# Patient Record
Sex: Female | Born: 1965 | Race: White | Hispanic: No | Smoking: Never smoker
Health system: Southern US, Community
[De-identification: ages and names within clinical notes are randomized; demographics above are authoritative.]

## PROBLEM LIST (undated history)

## (undated) DIAGNOSIS — C801 Malignant (primary) neoplasm, unspecified: Secondary | ICD-10-CM

---

## 2021-05-02 ENCOUNTER — Emergency Department: Payer: Self-pay

## 2021-05-02 ENCOUNTER — Emergency Department
Admission: EM | Admit: 2021-05-02 | Discharge: 2021-05-02 | Disposition: A | Discharge status: Home / Self Care | Payer: Self-pay | Attending: Emergency Medicine | Admitting: Emergency Medicine

## 2021-05-02 ENCOUNTER — Other Ambulatory Visit: Payer: Self-pay

## 2021-05-02 DIAGNOSIS — N939 Abnormal uterine and vaginal bleeding, unspecified: Secondary | ICD-10-CM

## 2021-05-02 DIAGNOSIS — N840 Polyp of corpus uteri: Secondary | ICD-10-CM | POA: Insufficient documentation

## 2021-05-02 DIAGNOSIS — N3000 Acute cystitis without hematuria: Secondary | ICD-10-CM | POA: Insufficient documentation

## 2021-05-02 LAB — CBC WITH DIFFERENTIAL/PLATELET
Abs Immature Granulocytes: 0.02 10*3/uL (ref 0.00–0.07)
Basophils Absolute: 0.1 10*3/uL (ref 0.0–0.1)
Basophils Relative: 1 %
Eosinophils Absolute: 0.3 10*3/uL (ref 0.0–0.5)
Eosinophils Relative: 3 %
HCT: 42 % (ref 36.0–46.0)
Hemoglobin: 13.2 g/dL (ref 12.0–15.0)
Immature Granulocytes: 0 %
Lymphocytes Relative: 23 %
Lymphs Abs: 1.9 10*3/uL (ref 0.7–4.0)
MCH: 28.9 pg (ref 26.0–34.0)
MCHC: 31.4 g/dL (ref 30.0–36.0)
MCV: 92.1 fL (ref 80.0–100.0)
Monocytes Absolute: 0.5 10*3/uL (ref 0.1–1.0)
Monocytes Relative: 6 %
Neutro Abs: 5.6 10*3/uL (ref 1.7–7.7)
Neutrophils Relative %: 67 %
Platelets: 329 10*3/uL (ref 150–400)
RBC: 4.56 MIL/uL (ref 3.87–5.11)
RDW: 12.1 % (ref 11.5–15.5)
WBC: 8.3 10*3/uL (ref 4.0–10.5)
nRBC: 0 % (ref 0.0–0.2)

## 2021-05-02 LAB — COMPREHENSIVE METABOLIC PANEL
ALT: 15 U/L (ref 0–44)
AST: 18 U/L (ref 15–41)
Albumin: 4.2 g/dL (ref 3.5–5.0)
Alkaline Phosphatase: 84 U/L (ref 38–126)
Anion gap: 10 (ref 5–15)
BUN: 10 mg/dL (ref 6–20)
CO2: 27 mmol/L (ref 22–32)
Calcium: 9.7 mg/dL (ref 8.9–10.3)
Chloride: 101 mmol/L (ref 98–111)
Creatinine, Ser: 0.69 mg/dL (ref 0.44–1.00)
GFR, Estimated: >60 mL/min (ref >60)
Glucose, Bld: 113 mg/dL — ABNORMAL HIGH (ref 70–99)
Potassium: 3.7 mmol/L (ref 3.5–5.1)
Sodium: 138 mmol/L (ref 135–145)
Total Bilirubin: 0.8 mg/dL (ref 0.3–1.2)
Total Protein: 8 g/dL (ref 6.5–8.1)

## 2021-05-02 LAB — TYPE AND SCREEN
ABO/RH(D): O POS
Antibody Screen: NEGATIVE

## 2021-05-02 LAB — URINALYSIS, ROUTINE W REFLEX MICROSCOPIC
Bacteria, UA: NONE SEEN
Glucose, UA: NEGATIVE mg/dL
Ketones, ur: 15 mg/dL — AB
Nitrite: POSITIVE — AB
Protein, ur: >300 mg/dL — AB
RBC / HPF: >50 RBC/hpf — ABNORMAL HIGH (ref 0–5)
Specific Gravity, Urine: 1.025 (ref 1.005–1.030)
WBC, UA: >50 WBC/hpf — ABNORMAL HIGH (ref 0–5)
pH: 5 (ref 5.0–8.0)

## 2021-05-02 LAB — POC URINE PREG, ED: Preg Test, Ur: NEGATIVE

## 2021-05-02 MED ORDER — CEPHALEXIN 500 MG PO CAPS
500.0000 mg | ORAL_CAPSULE | Freq: Two times a day (BID) | ORAL | 0 refills | Status: AC
Start: 2021-05-02 — End: 2021-05-09

## 2021-05-02 NOTE — ED Triage Notes (Signed)
Pt c/o vaginal bleeding since august and in the past 4 days having heavy bleeding with clots.

## 2021-05-02 NOTE — ED Notes (Signed)
Provider performed speculum exam.

## 2021-05-02 NOTE — ED Provider Notes (Signed)
[facility] Provider Note    Event Date/Time   First MD Initiated Contact with Patient 05/02/21 1601     (approximate)   History   Vaginal Bleeding   HPI  Andrea Gross is a 56 y.o. female with history of lymphedema presents to the emergency department for evaluation of vaginal bleeding that has been intermittent since August. Within the past 2 weeks, she has had persistent bleeding with increase and passing clots over the past 4 days. No fever or abdominal pain.      Physical Exam   Triage Vital Signs: ED Triage Vitals  Enc Vitals Group     BP 05/02/21 1540 (!) 137/94     Pulse Rate 05/02/21 1540 76     Resp 05/02/21 1540 17     Temp 05/02/21 1540 97.7 F (36.5 C)     Temp Source 05/02/21 1540 Oral     SpO2 05/02/21 1540 96 %     Weight 05/02/21 1542 180 lb (81.6 kg)     Height 05/02/21 1542 5\' 1"  (1.549 m)     Head Circumference --      Peak Flow --      Pain Score --      Pain Loc --      Pain Edu? --      Excl. in [place]? --     Most recent vital signs: Vitals:   05/02/21 1540 05/02/21 1840  BP: (!) 137/94 138/74  Pulse: 76 70  Resp: 17 20  Temp: 97.7 F (36.5 C)   SpO2: 96% 99%    General: Awake, no distress.  CV:  Good peripheral perfusion.  Resp:  Normal effort.  Abd:  No distention.  Other:  Cervix firm and friable, polyp noted on vaginal wall; scant dark red blood in vaginal  vault.    ED Results / Procedures / Treatments   Labs (all labs ordered are listed, but only abnormal results are displayed) Labs Reviewed  COMPREHENSIVE METABOLIC PANEL - Abnormal; Notable for the following components:      Result Value   Glucose, Bld 113 (*)    All other components within normal limits  URINALYSIS, ROUTINE W REFLEX MICROSCOPIC - Abnormal; Notable for the following components:   Color, Urine AMBER (*)    APPearance CLOUDY (*)    Hgb urine dipstick LARGE (*)    Bilirubin Urine MODERATE (*)    Ketones, ur 15 (*)    Protein,  ur >300 (*)    Nitrite POSITIVE (*)    Leukocytes,Ua SMALL (*)    RBC / HPF >50 (*)    WBC, UA >50 (*)    All other components within normal limits  CBC WITH DIFFERENTIAL/PLATELET  POC URINE PREG, ED  TYPE AND SCREEN     EKG  Not indicated   RADIOLOGY  US abdomen and pelvis shows an endometrial polyp at the fundus with a small amount of endometrial fluid.   PROCEDURES:  Critical Care performed: No  Procedures   MEDICATIONS ORDERED IN ED: Medications - No data to display   IMPRESSION / MDM / [place] / ED COURSE  I reviewed the triage vital signs and the nursing notes.                              Differential diagnosis includes, but is not limited to, abnormal uterine bleeding, fibroids, cervical CA  56 year old female presenting  to the emergency department for treatment and evaluation of vaginal bleeding.  See HPI for further details.   Labs are reassuring.  She has a normal hemoglobin and hematocrit.  Urinalysis is consistent with an acute cystitis she does have a positive nitrates, small leukocytes, greater than 50 white blood cells.  She reports that she has had some frequency but no dysuria.  We will treat with a few days of Keflex.  Results of the ultrasound discussed with the patient.  She was strongly encouraged to schedule a follow-up appointment with gynecology.  Patient states that she plans on moving back to [place] within the next few weeks and will schedule follow-up appointment then.  She was advised to go ahead and call and schedule the appointment that way when she gets there it will be scheduled.  If she decides not to return to [place], she was encouraged to call and schedule follow-up appointment here.  Patient is concerned about not having insurance in [place].  She was given information for the [place], open-door clinic, and on-call gynecology.  She was strongly advised to be evaluated as soon as possible.  She  was encouraged to return to the emergency department if the bleeding becomes bright red, heavy and feels a pad in 1 hour or less, or for other symptoms of concern.    FINAL CLINICAL IMPRESSION(S) / ED DIAGNOSES   Final diagnoses:  Vaginal bleeding  Acute cystitis without hematuria  Abnormal uterine bleeding due to endometrial polyp     Rx / DC Orders   ED Discharge Orders          Ordered    cephALEXin (KEFLEX) 500 MG capsule  2 times daily        05/02/21 1835             Note:  This document was prepared using Dragon voice recognition software and may include unintentional dictation errors.   Victorino Dike, FNP 05/05/21 2350    Lucrezia Starch, MD 05/06/21 1110

## 2021-05-02 NOTE — ED Notes (Signed)
Pt to ED for vaginal bleeding. States has had normal monthly periods until 7/22, then next period in 8/22 started and "never stopped" with daily bleeding, but normal amount as regular periods.  Last 4 days has had "gushing and blood clots", some are stringy, some round, some flat, largest size was "the size of an egg" and round. Pt also has lower abdominal cramping over last several days that is more pronounced than usual period cramping. States has been changing pads more often and some times has had to change large maternity pads more often than every hour.  Pt denies dizziness or weakness. Denies CP and SOB.

## 2022-06-21 ENCOUNTER — Other Ambulatory Visit: Payer: Self-pay

## 2022-06-21 DIAGNOSIS — E876 Hypokalemia: Secondary | ICD-10-CM | POA: Insufficient documentation

## 2022-06-21 DIAGNOSIS — G8929 Other chronic pain: Secondary | ICD-10-CM | POA: Insufficient documentation

## 2022-06-21 DIAGNOSIS — C539 Malignant neoplasm of cervix uteri, unspecified: Secondary | ICD-10-CM | POA: Insufficient documentation

## 2022-06-21 DIAGNOSIS — D649 Anemia, unspecified: Secondary | ICD-10-CM | POA: Insufficient documentation

## 2022-06-21 DIAGNOSIS — R102 Pelvic and perineal pain: Secondary | ICD-10-CM | POA: Diagnosis present

## 2022-06-21 DIAGNOSIS — N390 Urinary tract infection, site not specified: Secondary | ICD-10-CM | POA: Insufficient documentation

## 2022-06-21 LAB — CBC
HCT: 23.5 % — ABNORMAL LOW (ref 36.0–46.0)
Hemoglobin: 6.4 g/dL — ABNORMAL LOW (ref 12.0–15.0)
MCH: 18.3 pg — ABNORMAL LOW (ref 26.0–34.0)
MCHC: 27.2 g/dL — ABNORMAL LOW (ref 30.0–36.0)
MCV: 67.3 fL — ABNORMAL LOW (ref 80.0–100.0)
Platelets: 596 10*3/uL — ABNORMAL HIGH (ref 150–400)
RBC: 3.49 MIL/uL — ABNORMAL LOW (ref 3.87–5.11)
RDW: 20.2 % — ABNORMAL HIGH (ref 11.5–15.5)
WBC: 8.8 10*3/uL (ref 4.0–10.5)
nRBC: 0 % (ref 0.0–0.2)

## 2022-06-21 LAB — COMPREHENSIVE METABOLIC PANEL
ALT: 7 U/L (ref 0–44)
AST: 12 U/L — ABNORMAL LOW (ref 15–41)
Albumin: 3.2 g/dL — ABNORMAL LOW (ref 3.5–5.0)
Alkaline Phosphatase: 65 U/L (ref 38–126)
Anion gap: 7 (ref 5–15)
BUN: 10 mg/dL (ref 6–20)
CO2: 28 mmol/L (ref 22–32)
Calcium: 8.6 mg/dL — ABNORMAL LOW (ref 8.9–10.3)
Chloride: 98 mmol/L (ref 98–111)
Creatinine, Ser: 0.55 mg/dL (ref 0.44–1.00)
GFR, Estimated: >60 mL/min (ref >60)
Glucose, Bld: 109 mg/dL — ABNORMAL HIGH (ref 70–99)
Potassium: 2.8 mmol/L — ABNORMAL LOW (ref 3.5–5.1)
Sodium: 133 mmol/L — ABNORMAL LOW (ref 135–145)
Total Bilirubin: 0.5 mg/dL (ref 0.3–1.2)
Total Protein: 8 g/dL (ref 6.5–8.1)

## 2022-06-21 LAB — LIPASE, BLOOD: Lipase: 38 U/L (ref 11–51)

## 2022-06-21 NOTE — ED Triage Notes (Signed)
Patient reports pelvic pain. Patient has been taking aleve and ibuprofen. Patient has a history of cancer. Medication is not helping the pain. No changes in bowel habits. Patient reports not being able to eat much. Endorses nausea with no vomiting. Patient lives in [place] and is here visiting her grandchildren.

## 2022-06-21 NOTE — ED Notes (Signed)
Called lab to help obtain blood work in triage.

## 2022-06-22 ENCOUNTER — Emergency Department
Admission: EM | Admit: 2022-06-22 | Discharge: 2022-06-22 | Disposition: A | Discharge status: Home / Self Care | Payer: BLUE CROSS/BLUE SHIELD | Attending: Emergency Medicine | Admitting: Emergency Medicine

## 2022-06-22 ENCOUNTER — Encounter: Payer: Self-pay | Admitting: Emergency Medicine

## 2022-06-22 DIAGNOSIS — D500 Iron deficiency anemia secondary to blood loss (chronic): Secondary | ICD-10-CM | POA: Insufficient documentation

## 2022-06-22 DIAGNOSIS — Z791 Long term (current) use of non-steroidal anti-inflammatories (NSAID): Secondary | ICD-10-CM

## 2022-06-22 DIAGNOSIS — R102 Pelvic and perineal pain: Secondary | ICD-10-CM | POA: Diagnosis not present

## 2022-06-22 DIAGNOSIS — C539 Malignant neoplasm of cervix uteri, unspecified: Secondary | ICD-10-CM

## 2022-06-22 DIAGNOSIS — Z8719 Personal history of other diseases of the digestive system: Secondary | ICD-10-CM

## 2022-06-22 DIAGNOSIS — G8929 Other chronic pain: Secondary | ICD-10-CM

## 2022-06-22 DIAGNOSIS — E876 Hypokalemia: Secondary | ICD-10-CM | POA: Insufficient documentation

## 2022-06-22 DIAGNOSIS — D649 Anemia, unspecified: Secondary | ICD-10-CM | POA: Diagnosis not present

## 2022-06-22 DIAGNOSIS — N390 Urinary tract infection, site not specified: Secondary | ICD-10-CM | POA: Insufficient documentation

## 2022-06-22 DIAGNOSIS — N939 Abnormal uterine and vaginal bleeding, unspecified: Secondary | ICD-10-CM

## 2022-06-22 HISTORY — DX: Malignant (primary) neoplasm, unspecified: C80.1

## 2022-06-22 LAB — RETICULOCYTES
Immature Retic Fract: 12.2 % (ref 2.3–15.9)
RBC.: 3.21 MIL/uL — ABNORMAL LOW (ref 3.87–5.11)
Retic Count, Absolute: 38.5 10*3/uL (ref 19.0–186.0)
Retic Ct Pct: 1.2 % (ref 0.4–3.1)

## 2022-06-22 LAB — URINALYSIS, ROUTINE W REFLEX MICROSCOPIC
Bilirubin Urine: NEGATIVE
Glucose, UA: NEGATIVE mg/dL
Ketones, ur: NEGATIVE mg/dL
Nitrite: NEGATIVE
Protein, ur: >=300 mg/dL — AB
RBC / HPF: >50 RBC/hpf (ref 0–5)
Specific Gravity, Urine: 1.02 (ref 1.005–1.030)
Squamous Epithelial / HPF: NONE SEEN /HPF (ref 0–5)
WBC, UA: >50 WBC/hpf (ref 0–5)
pH: 5 (ref 5.0–8.0)

## 2022-06-22 LAB — IRON AND TIBC
Iron: 11 ug/dL — ABNORMAL LOW (ref 28–170)
Saturation Ratios: 4 % — ABNORMAL LOW (ref 10.4–31.8)
TIBC: 308 ug/dL (ref 250–450)
UIBC: 297 ug/dL

## 2022-06-22 LAB — VITAMIN B12: Vitamin B-12: 400 pg/mL (ref 180–914)

## 2022-06-22 LAB — PREPARE RBC (CROSSMATCH)

## 2022-06-22 LAB — MAGNESIUM: Magnesium: 1.8 mg/dL (ref 1.7–2.4)

## 2022-06-22 LAB — FERRITIN: Ferritin: 6 ng/mL — ABNORMAL LOW (ref 11–307)

## 2022-06-22 LAB — FOLATE: Folate: 11.9 ng/mL (ref >5.9)

## 2022-06-22 MED ORDER — OXYCODONE-ACETAMINOPHEN 5-325 MG PO TABS: 2.0000 | ORAL_TABLET | Freq: Four times a day (QID) | ORAL | 0 refills | Status: AC | PRN

## 2022-06-22 MED ORDER — DOCUSATE SODIUM 100 MG PO CAPS: 100.0000 mg | ORAL_CAPSULE | Freq: Two times a day (BID) | ORAL | 2 refills | Status: AC

## 2022-06-22 MED ORDER — SODIUM CHLORIDE 0.9 % IV SOLN
10.0000 mL/h | Freq: Once | INTRAVENOUS | Status: AC
  Administered 2022-06-22: 10 mL/h via INTRAVENOUS

## 2022-06-22 MED ORDER — MORPHINE SULFATE (PF) 4 MG/ML IV SOLN
4.0000 mg | Freq: Once | INTRAVENOUS | Status: AC
  Administered 2022-06-22: 4 mg via INTRAVENOUS
  Filled 2022-06-22: qty 1

## 2022-06-22 MED ORDER — POTASSIUM CHLORIDE CRYS ER 20 MEQ PO TBCR
40.0000 meq | EXTENDED_RELEASE_TABLET | Freq: Once | ORAL | Status: AC
  Administered 2022-06-22: 40 meq via ORAL
  Filled 2022-06-22: qty 2

## 2022-06-22 MED ORDER — FERROUS SULFATE 325 (65 FE) MG PO TBEC: 325.0000 mg | DELAYED_RELEASE_TABLET | Freq: Every day | ORAL | 11 refills | Status: AC

## 2022-06-22 MED ORDER — ONDANSETRON HCL 4 MG/2ML IJ SOLN
4.0000 mg | Freq: Once | INTRAMUSCULAR | Status: AC
  Administered 2022-06-22: 4 mg via INTRAVENOUS
  Filled 2022-06-22: qty 2

## 2022-06-22 MED ORDER — ONDANSETRON 4 MG PO TBDP: 4.0000 mg | ORAL_TABLET | Freq: Four times a day (QID) | ORAL | 0 refills | Status: AC | PRN

## 2022-06-22 MED ORDER — SODIUM CHLORIDE 0.9 % IV SOLN
2.0000 g | Freq: Once | INTRAVENOUS | Status: AC
  Administered 2022-06-22: 2 g via INTRAVENOUS
  Filled 2022-06-22: qty 20

## 2022-06-22 MED ORDER — POTASSIUM CHLORIDE 10 MEQ/100ML IV SOLN
10.0000 meq | INTRAVENOUS | Status: AC
  Administered 2022-06-22 (×2): 10 meq via INTRAVENOUS
  Filled 2022-06-22: qty 100

## 2022-06-22 NOTE — Progress Notes (Signed)
IV obtained by ED RN. 

## 2022-06-22 NOTE — Assessment & Plan Note (Signed)
Urinalysis consistent with UTI however patient denies dysuria Antibiotic per ED preference

## 2022-06-22 NOTE — Assessment & Plan Note (Addendum)
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

## 2022-06-22 NOTE — Assessment & Plan Note (Signed)
NSAID long-term use Patient has a history of hematemesis back in November 2023 while in [place].  EGD was negative She denies any nausea, vomiting, blood in stool or black stool Judicious NSAID use Recommend PPI

## 2022-06-22 NOTE — Assessment & Plan Note (Signed)
Recommend IV and oral potassium repletion in the ED with recheck prior to discharge and recheck on follow-up with PCP on return to [place] in 3 to 4 days

## 2022-06-22 NOTE — Consult Note (Addendum)
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 Patient is symptomatic for fatigue only Patient reports vaginal blood loss is no worse than baseline and has been ongoing for several months to years (corroborated on review of discharge summary from hospitalization in [place] in November 2023 Patient has history of GI bleed but this is not suspected at this time Agree with transfusion of 1 unit PRBC for hemoglobin of 6.4 Follow-up on return to [place] which patient says will happen in 2 to 3 days  History of upper GI bleed 02/2022 with negative EGD NSAID long-term use Patient has a history of hematemesis back in November 2023 while in [place].  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 Patient states a pelvic pain is unchanged from her baseline, may be a little worse Continue pain meds with Tylenol, as needed NSAIDs with PPI for GI protection and can cover with as needed oxycodone until return to PCP in a few days  Urinary tract infection Urinalysis consistent with UTI however patient denies dysuria Antibiotic per ED preference  Hypokalemia Recommend IV and oral potassium repletion in the ED with recheck prior to discharge and recheck on follow-up with PCP on return to [place] in 3 to 4 days   Consult was requested for possible admission however was declined for the following reasons. 1.  Patient presented for pelvic pain which can be addressed without need  for admission 2.  Hemoglobin of 6.4 is symptomatic from mild fatigue which can be corrected with transfusion of 1 unit PRBCs in the ED 2.  Patient has not pursued any further treatment of her stage IV cervical cancer and wishes to return to [place] in 2 to 3 days 3.  Potassium can be replenished in the emergency room 4.  For UTI, given that patient is not symptomatic, oral antibiotics can be prescribed from the ED 5.  Patient's pelvic pain appears to be chronic, vaginal bleeding is chronic and stable and her symptoms are expected to recur  given that she has opted for no treatment of her cervical cancer and as such the benefit of hospitalization would be only to transfuse on an as-needed basis and treat symptoms related to her untreated cervical cancer..  This was discussed in detail with the ED provider Dr. Leonides Schanz as well as with patient.  Patient was agreeable, stating that her plan is to return to [place] in 2 to 3 days.    TRH will sign off at present, please call us again when needed.  HPI: Andrea Gross is a 57 y.o. female with past medical history of Stage IV cervical cancer with erosion into bladder who has declined any further follow-up following her diagnosis in November 2023, history of upper GI bleed, currently visiting family from [place] for the past 2 weeks who presents to the ED with pelvic pain beyond her baseline for cervical cancer, unrelieved with her usual Aleve, ibuprofen and Tylenol.  She has vaginal bleeding but it is no worse than her baseline.  She has nausea but  without vomiting.  She denies black or bloody stool. ED course and data review: Tachycardic to 116 with otherwise normal vitals.  Labs notable for hemoglobin of 6.4, potassium of 2.8 and urinalysis with large leukocyte esterase and many bacteria. EKG, independently interpreted showed sinus rhythm at 77. Patient was treated with morphine and Zofran and given a dose of ceftriaxone as well as a potassium 10 mill equivalent  rider and oral potassium 40 mEq.  Transfusion of 1 unit PRBCs was initiated.  Review of Systems: As mentioned in the history of present illness. All other systems reviewed and are negative. Past Medical History:  Diagnosis Date   Cancer [facility])     Social History:  reports that she has never smoked. She has never used smokeless tobacco. She reports that she does not drink alcohol and does not use drugs.  No Known Allergies  No family history on file.  Prior to Admission medications   Medication Sig Start Date End Date Taking? Authorizing Provider  ezetimibe (ZETIA) 10 MG tablet Take 10 mg by mouth daily.    [provider]  hydrochlorothiazide (HYDRODIURIL) 25 MG tablet Take 25 mg by mouth daily.    [provider]  levothyroxine (SYNTHROID) 75 MCG tablet Take 75 mcg by mouth daily before breakfast.    [provider]  PARoxetine (PAXIL) 40 MG tablet Take 40 mg by mouth every morning.    [provider]    Physical Exam: Vitals:   06/21/22 2018 06/22/22 0200 06/22/22 0215 06/22/22 0225  BP: 118/76 123/72    Pulse: (!) 116 94 89   Resp: 15     Temp: 98.3 F (36.8 C)   98.2 F (36.8 C)  TempSrc: Oral   Oral  SpO2: 100% (!) 88% 100%   Weight: 55.3 kg     Height: 5\' 1"  (1.549 m)      Physical Exam Vitals and nursing note reviewed.  Constitutional:      General: She is not in acute distress. HENT:     Head: Normocephalic and atraumatic.  Cardiovascular:     Rate and Rhythm: Normal rate and regular rhythm.     Heart sounds: Normal heart sounds.  Pulmonary:     Effort: Pulmonary effort is normal.     Breath sounds: Normal breath sounds.  Abdominal:     Palpations: Abdomen is soft.     Tenderness: There is no abdominal tenderness.  Neurological:     Mental Status: She is alert. Mental status is at baseline.     Data Reviewed: Relevant notes from primary care and specialist visits, past discharge summaries as available in EHR,  including Care Everywhere. Prior diagnostic testing as pertinent to current admission diagnoses Updated medications and problem lists for reconciliation ED course, including vitals, labs, imaging, treatment and response to treatment Triage notes, nursing and pharmacy notes and ED provider's notes Notable results as noted in HPI   Family Communication: none Primary team communication: Dr Leonides Schanz Thank you very much for involving [place] in the care of your patient.  Author: Athena Masse, MD 06/22/2022 2:46 AM  For on call review [email or web address].

## 2022-06-22 NOTE — Assessment & Plan Note (Signed)
Patient states a pelvic pain is unchanged from her baseline, may be a little worse Continue pain meds with Tylenol, as needed NSAIDs with PPI for GI protection and can cover with as needed oxycodone until return to PCP in a few days

## 2022-06-22 NOTE — ED Provider Notes (Signed)
[facility] Provider Note    Event Date/Time   First MD Initiated Contact with Patient 06/22/22 0107     (approximate)   History   Abdominal Pain   HPI  Andrea Gross is a 57 y.o. female with history of stage IV cervical cancer currently not opting for any treatment who presents to the emergency department with complaints of chronic pelvic pain that is uncontrolled with her NSAIDs, continued vaginal bleeding that has been ongoing for months, feeling fatigued, dysuria.  She states that she is here visiting her grandchildren from [place] and plans to go home by [place] bus in the next 2 to 3 days.  She denies that this pain is new for her and states that is chronic in nature from her cancer as is her vaginal bleeding.  She has been anemic and is on iron supplementation but states her last transfusion was when her children were born.  She denies hematemesis, bloody stools or melena.  No fevers, cough, chest pain, shortness of breath, vomiting or diarrhea.  Has had some nausea.   History provided by patient.    Past Medical History:  Diagnosis Date   Cancer [facility])     History reviewed. No pertinent surgical history.  MEDICATIONS:  Prior to Admission medications   Medication Sig Start Date End Date Taking? Authorizing Provider  ezetimibe (ZETIA) 10 MG tablet Take 10 mg by mouth daily.    [provider]  hydrochlorothiazide (HYDRODIURIL) 25 MG tablet Take 25 mg by mouth daily.    [provider]  levothyroxine (SYNTHROID) 75 MCG tablet Take 75 mcg by mouth daily before breakfast.    [provider]  PARoxetine (PAXIL) 40 MG tablet Take 40 mg by mouth every morning.    [provider]    Physical Exam   Triage Vital Signs: ED Triage Vitals [06/21/22 2018]  Enc Vitals Group     BP 118/76     Pulse Rate (!) 116     Resp 15     Temp 98.3 F (36.8 C)     Temp Source Oral     SpO2 100 %     Weight 122 lb (55.3 kg)      Height 5\' 1"  (1.549 m)     Head Circumference      Peak Flow      Pain Score 10     Pain Loc      Pain Edu?      Excl. in [place]?     Most recent vital signs: Vitals:   06/22/22 0400 06/22/22 0415  BP: 115/66 (!) 111/57  Pulse: 77 80  Resp: 17 (!) 29  Temp:    SpO2: 100% 100%    CONSTITUTIONAL: Alert, responds appropriately to questions.  Chronically ill-appearing, in no distress HEAD: Normocephalic, atraumatic EYES: Conjunctivae clear, pupils appear equal, sclera nonicteric ENT: normal nose; moist mucous membranes NECK: Supple, normal ROM CARD: RRR; S1 and S2 appreciated RESP: Normal chest excursion without splinting or tachypnea; breath sounds clear and equal bilaterally; no wheezes, no rhonchi, no rales, no hypoxia or respiratory distress, speaking full sentences ABD/GI: Non-distended; soft, non-tender, no rebound, no guarding, no peritoneal signs BACK: The back appears normal EXT: Normal ROM in all joints; no deformity noted, no edema SKIN: Normal color for age and race; warm; no rash on exposed skin NEURO: Moves all extremities equally, normal speech PSYCH: The patient's mood and manner are appropriate.   ED Results / Procedures /  Treatments   LABS: (all labs ordered are listed, but only abnormal results are displayed) Labs Reviewed  COMPREHENSIVE METABOLIC PANEL - Abnormal; Notable for the following components:      Result Value   Sodium 133 (*)    Potassium 2.8 (*)    Glucose, Bld 109 (*)    Calcium 8.6 (*)    Albumin 3.2 (*)    AST 12 (*)    All other components within normal limits  CBC - Abnormal; Notable for the following components:   RBC 3.49 (*)    Hemoglobin 6.4 (*)    HCT 23.5 (*)    MCV 67.3 (*)    MCH 18.3 (*)    MCHC 27.2 (*)    RDW 20.2 (*)    Platelets 596 (*)    All other components within normal limits  URINALYSIS, ROUTINE W REFLEX MICROSCOPIC - Abnormal; Notable for the following components:   Color, Urine AMBER (*)    APPearance  CLOUDY (*)    Hgb urine dipstick LARGE (*)    Protein, ur >=300 (*)    Leukocytes,Ua LARGE (*)    Bacteria, UA MANY (*)    All other components within normal limits  IRON AND TIBC - Abnormal; Notable for the following components:   Iron 11 (*)    Saturation Ratios 4 (*)    All other components within normal limits  FERRITIN - Abnormal; Notable for the following components:   Ferritin 6 (*)    All other components within normal limits  RETICULOCYTES - Abnormal; Notable for the following components:   RBC. 3.21 (*)    All other components within normal limits  URINE CULTURE  LIPASE, BLOOD  FOLATE  MAGNESIUM  VITAMIN B12  TYPE AND SCREEN  PREPARE RBC (CROSSMATCH)     EKG:  EKG Interpretation  Date/Time:  Sunday June 22 2022 02:22:06 EDT Ventricular Rate:  77 PR Interval:  181 QRS Duration: 96 QT Interval:  397 QTC Calculation: 450 R Axis:   28 Text Interpretation: Sinus rhythm Confirmed by Pryor Curia [phone]) on 06/22/2022 2:51:13 AM         RADIOLOGY: My personal review and interpretation of imaging:    I have personally reviewed all radiology reports.   No results found.   PROCEDURES:  Critical Care performed: Yes, see critical care procedure note(s)   CRITICAL CARE Performed by: Pryor Curia   Total critical care time: 40 minutes  Critical care time was exclusive of separately billable procedures and treating other patients.  Critical care was necessary to treat or prevent imminent or life-threatening deterioration.  Critical care was time spent personally by me on the following activities: development of treatment plan with patient and/or surrogate as well as nursing, discussions with consultants, evaluation of patient's response to treatment, examination of patient, obtaining history from patient or surrogate, ordering and performing treatments and interventions, ordering and review of laboratory studies, ordering and review of radiographic  studies, pulse oximetry and re-evaluation of patient's condition.   Marland Kitchen1-3 Lead EKG Interpretation  Performed by: Aleksandr Pellow, Delice Bison, DO Authorized by: Tamiki Kuba, Delice Bison, DO     Interpretation: normal     ECG rate:  89   ECG rate assessment: normal     Rhythm: sinus rhythm     Ectopy: none     Conduction: normal       IMPRESSION / MDM / ASSESSMENT AND PLAN / ED COURSE  I reviewed the triage vital signs and the nursing  notes.    Patient here with complaints of chronic pelvic pain, vaginal bleeding and feeling fatigued.  States she is here just for symptomatic relief.  The patient is on the cardiac monitor to evaluate for evidence of arrhythmia and/or significant heart rate changes.   DIFFERENTIAL DIAGNOSIS (includes but not limited to):   Anemia, electrolyte derangement, UTI, dehydration, worsening cancer   Patient's presentation is most consistent with acute presentation with potential threat to life or bodily function.   PLAN: Labs obtained from triage.  Patient has a hemoglobin of 6.4 which is down trended from 8.2 in November 2023.  She has a reactive thrombocytosis.  This is likely from chronic vaginal bleeding.  She states she is not interested in chemotherapy, radiation or surgical interventions.  She does agree to a blood transfusion here for symptomatic relief.  She is not on blood thinners.  Denies bloody stools, melena or hematemesis.  Her abdominal exam today is benign.  No indication for emergent imaging given patient states she would not do anything different if something abnormal was found regarding her cancer today.  She does have urinary symptoms and appears to have a UTI here.  Will add on urine culture and give Rocephin.  We did discuss that NSAIDs are likely not a great option for her for pain control given history of gastritis and previous upper GI bleed.  Will give morphine for chronic pain control.   Patient's potassium level also low at 2.8.  She denies any  vomiting or diarrhea.  Magnesium level normal.  EKG shows no interval changes.  Will give IV and oral replacement.   Patient requesting admission to the hospital.   [facility] ED: Medications  0.9 %  sodium chloride infusion (has no administration in time range)  potassium chloride 10 mEq in 100 mL IVPB (10 mEq Intravenous New Bag/Given 06/22/22 0401)  morphine (PF) 4 MG/ML injection 4 mg (4 mg Intravenous Given 06/22/22 0149)  ondansetron (ZOFRAN) injection 4 mg (4 mg Intravenous Given 06/22/22 0149)  potassium chloride SA (KLOR-CON M) CR tablet 40 mEq (40 mEq Oral Given 06/22/22 0207)  cefTRIAXone (ROCEPHIN) 2 g in sodium chloride 0.9 % 100 mL IVPB (0 g Intravenous Stopped 06/22/22 0234)     ED COURSE: Discussed with hospitalist Dr. Damita Dunnings.  After reviewing her records and talking to the patient she feels patient would be appropriate for treatment in the ED and discharged home.  Discussed this with patient and he was comfortable with this plan.  She will get 1 unit of packed red blood cells here, plan on IV potassium and IV antibiotics.   CONSULTS: Dr. Damita Dunnings with hospitalist service has seen patient in consultation.   OUTSIDE RECORDS REVIEWED: Reviewed multiple recent records from [place].       FINAL CLINICAL IMPRESSION(S) / ED DIAGNOSES   Final diagnoses:  Symptomatic anemia  Chronic pelvic pain in female  Hypokalemia  Acute UTI     Rx / DC Orders   ED Discharge Orders          Ordered    ferrous sulfate 325 (65 FE) MG EC tablet  Daily with breakfast        06/22/22 0347    ondansetron (ZOFRAN-ODT) 4 MG disintegrating tablet  Every 6 hours PRN        06/22/22 0347    oxyCODONE-acetaminophen (PERCOCET) 5-325 MG tablet  Every 6 hours PRN        06/22/22 0347    docusate sodium (  COLACE) 100 MG capsule  2 times daily        06/22/22 0347             Note:  This document was prepared using Dragon voice recognition software and may include unintentional  dictation errors.   Kable Haywood, Delice Bison, DO 06/22/22 [phone]

## 2022-06-24 LAB — URINE CULTURE: Culture: >=100000 — AB

## 2022-06-24 LAB — BPAM RBC
Blood Product Expiration Date: 202404182359
ISSUE DATE / TIME: 202403170335
Unit Type and Rh: 5100

## 2022-06-24 LAB — TYPE AND SCREEN
ABO/RH(D): O POS
Antibody Screen: NEGATIVE
Unit division: 0

## 2023-02-21 IMAGING — US US PELVIS COMPLETE WITH TRANSVAGINAL
1 series · 13 of 25 positions shown · non-contrast
Comparison: None

CLINICAL DATA: Vaginal bleeding since [REDACTED], worsened over past 4
days, LMP 11/07/2020



[Series 1: us pelvic complete with transvaginal · 93 acquisitions, 13 frames shown]
[im 1/93]
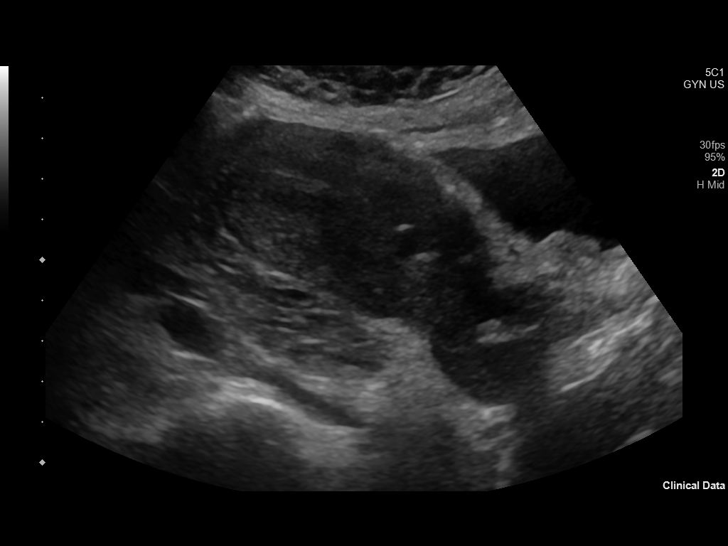
[im 8/93]
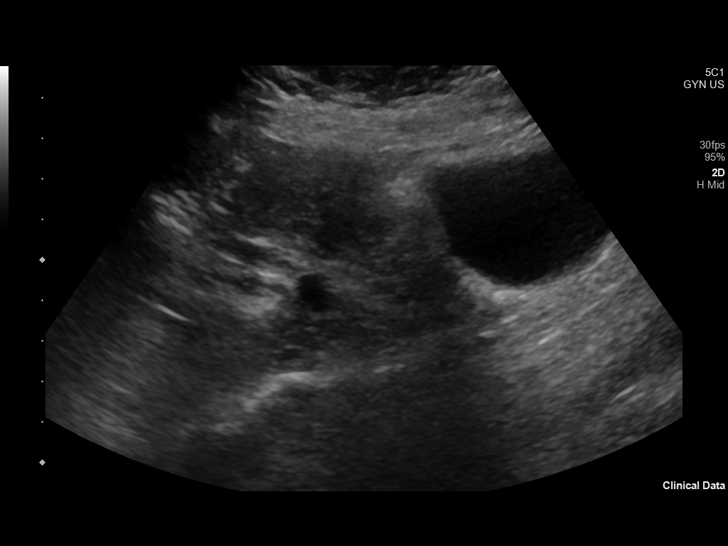
[im 16/93]
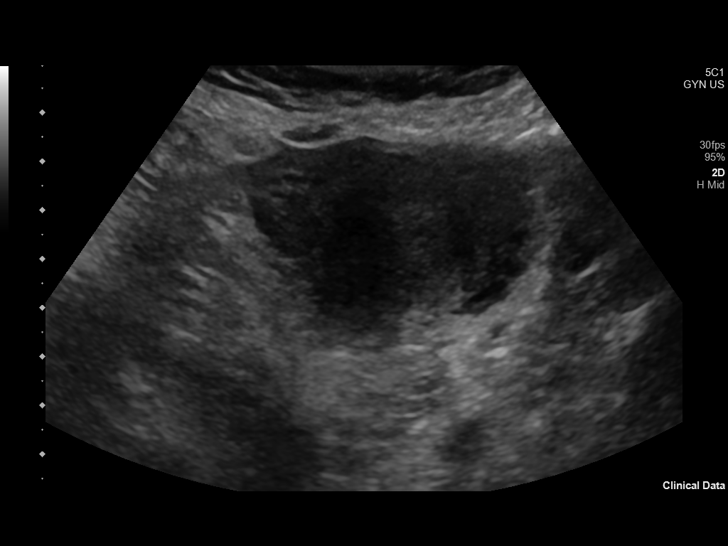
[im 24/93]
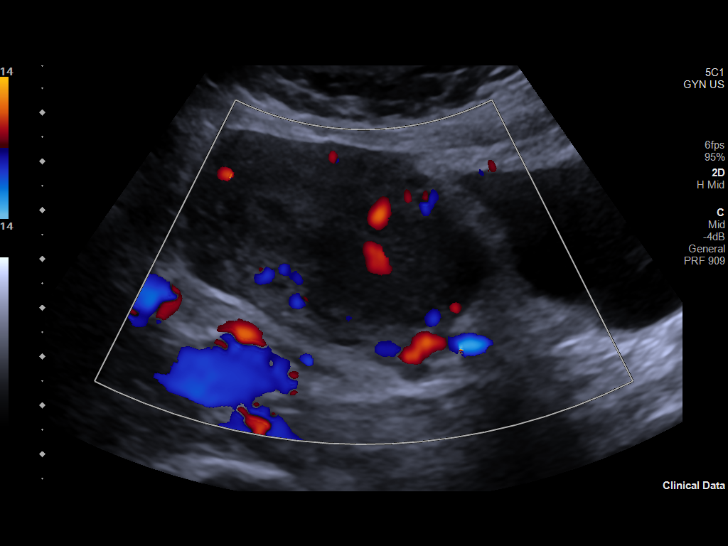
[im 31/93]
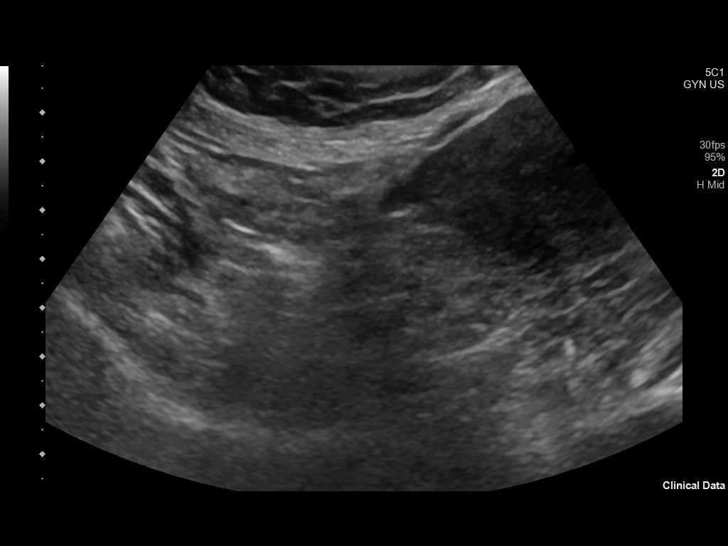
[im 39/93]
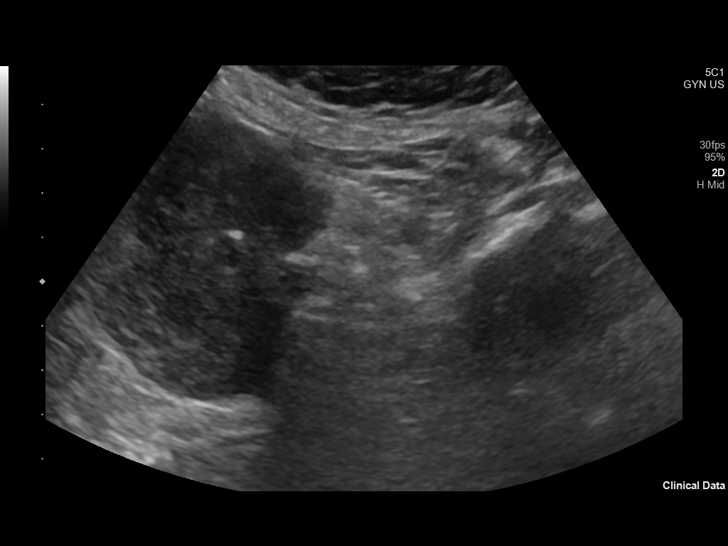
[im 47/93]
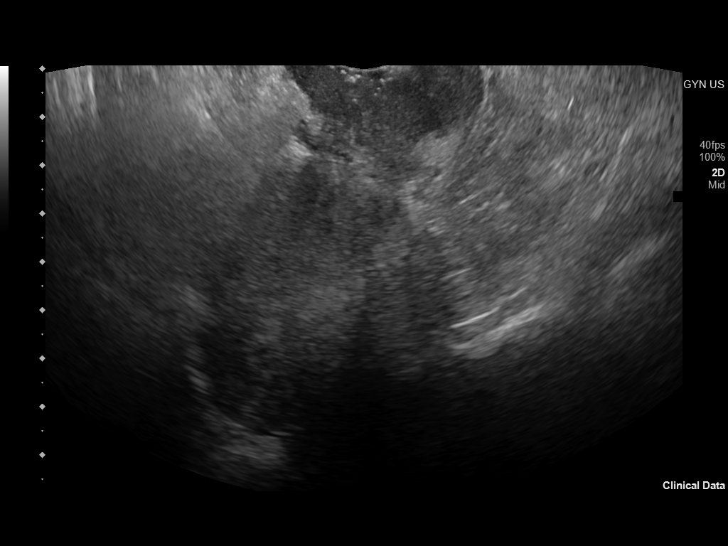
[im 54/93]
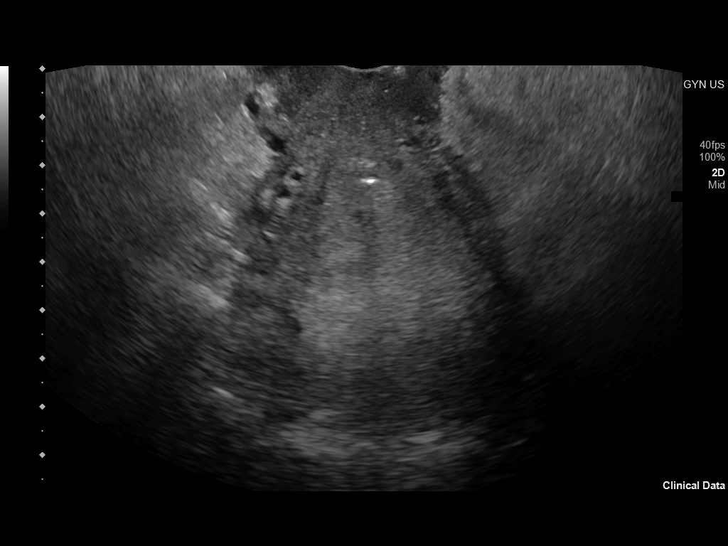
[im 62/93]
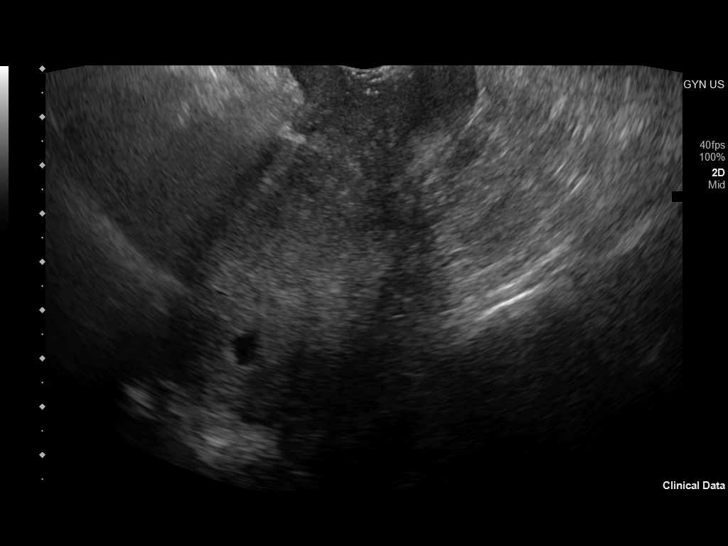
[im 70/93]
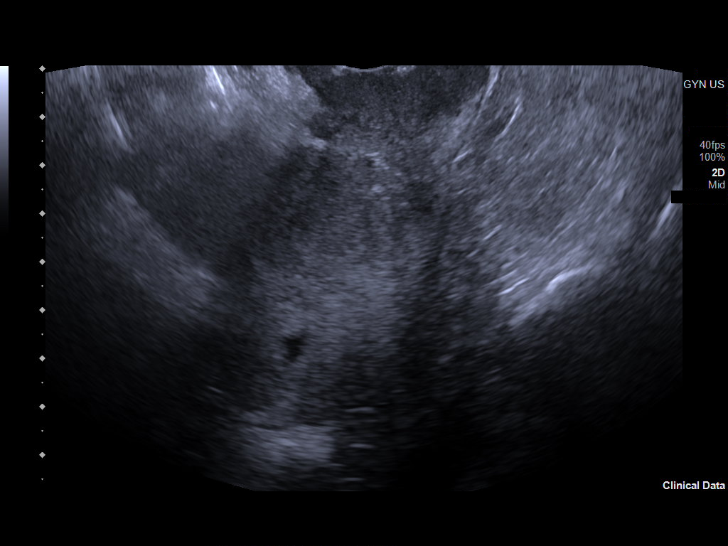
[im 77/93]
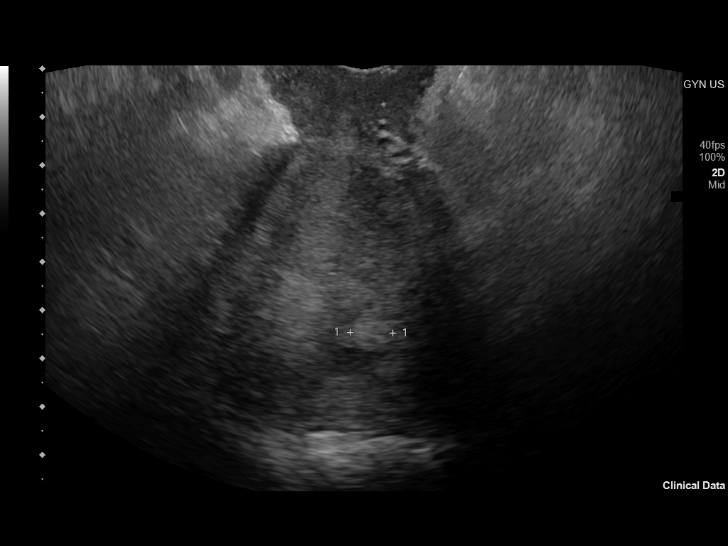
[im 85/93]
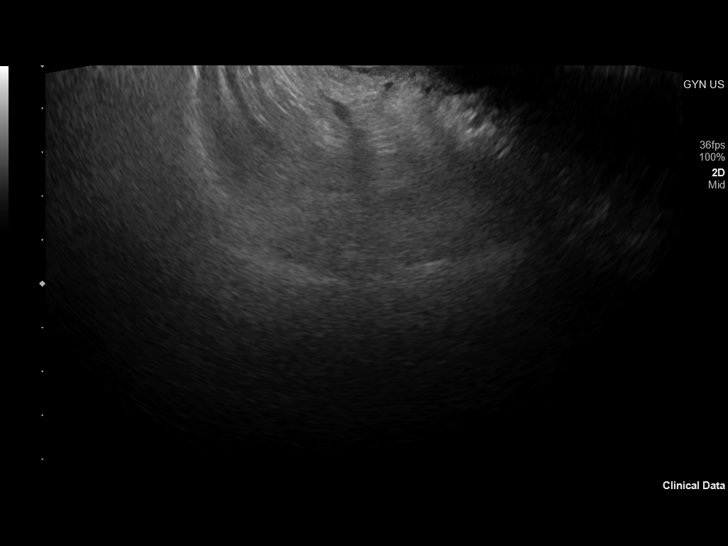
[im 93/93]
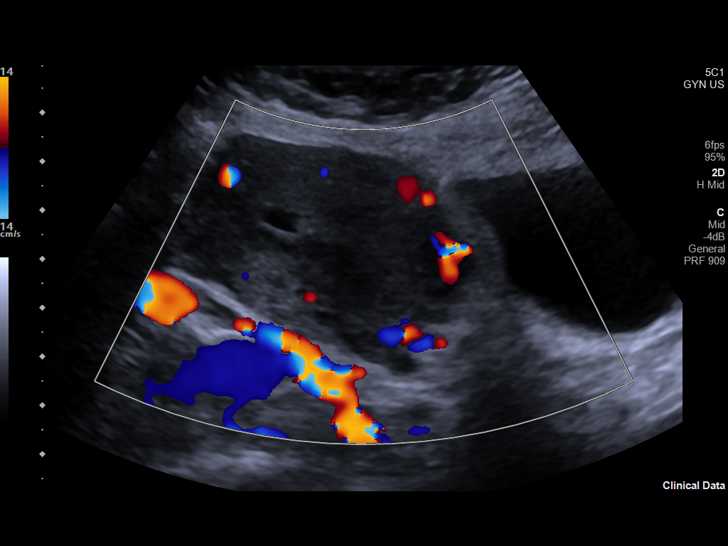

[13 of 25 positions shown; findings below may reference images not displayed]

FINDINGS: Uterus

Measurements: 9.4 x 4.3 x 5.9 cm = volume: 124 mL. Anteverted.
Normal morphology without mass. Few calcifications within myometrium
at lower uterine segment. Probable anterior wall Caesarean section
scar.

Endometrium

Thickness: 8 mm. Small amount of nonspecific fluid within
endometrial canal at fundus. Adjacent hyperechoic nodule within
endometrium 10 x 7 x 9 mm suspicious for endometrial polyp.

Right ovary

Not visualized, likely obscured by bowel

Left ovary

Not visualized, likely obscured by bowel

Other findings

No free pelvic fluid or adnexal masses.
IMPRESSION: Nonvisualization of ovaries.

Suspected 10 x 7 x 9 mm endometrial polyp at the fundus with small
amount of endometrial fluid; consider further evaluation with
sonohysterogram for confirmation prior to hysteroscopy. Endometrial
sampling should also be considered if patient is at high risk for
endometrial carcinoma. (Ref: Radiological Reasoning: Algorithmic
Workup of Abnormal Vaginal Bleeding with Endovaginal Sonography and
Sonohysterography. [id number]; [id number]) .

Remainder of exam unremarkable.

## 2023-07-07 DEATH — deceased
